# Patient Record
Sex: Female | Born: 1950
Health system: Southern US, Community
[De-identification: ages and names within clinical notes are randomized; demographics above are authoritative.]

## PROBLEM LIST (undated history)

## (undated) DIAGNOSIS — F418 Other specified anxiety disorders: Secondary | ICD-10-CM

## (undated) DIAGNOSIS — R002 Palpitations: Secondary | ICD-10-CM

## (undated) DIAGNOSIS — M810 Age-related osteoporosis without current pathological fracture: Secondary | ICD-10-CM

## (undated) DIAGNOSIS — K5900 Constipation, unspecified: Secondary | ICD-10-CM

## (undated) DIAGNOSIS — E2839 Other primary ovarian failure: Secondary | ICD-10-CM

## (undated) DIAGNOSIS — H9312 Tinnitus, left ear: Secondary | ICD-10-CM

## (undated) DIAGNOSIS — E785 Hyperlipidemia, unspecified: Secondary | ICD-10-CM

## (undated) DIAGNOSIS — E119 Type 2 diabetes mellitus without complications: Secondary | ICD-10-CM

## (undated) DIAGNOSIS — M8580 Other specified disorders of bone density and structure, unspecified site: Secondary | ICD-10-CM

## (undated) DIAGNOSIS — M722 Plantar fascial fibromatosis: Secondary | ICD-10-CM

## (undated) DIAGNOSIS — E669 Obesity, unspecified: Secondary | ICD-10-CM

## (undated) HISTORY — DX: Plantar fascial fibromatosis: M72.2

## (undated) HISTORY — DX: Obesity, unspecified: E66.9

## (undated) HISTORY — DX: Constipation, unspecified: K59.00

## (undated) HISTORY — DX: Tinnitus, left ear: H93.12

## (undated) HISTORY — PX: ABDOMINAL HYSTERECTOMY: SHX81

## (undated) HISTORY — DX: Type 2 diabetes mellitus without complications: E11.9

## (undated) HISTORY — DX: Other primary ovarian failure: E28.39

## (undated) HISTORY — DX: Other specified anxiety disorders: F41.8

## (undated) HISTORY — DX: Palpitations: R00.2

## (undated) HISTORY — DX: Age-related osteoporosis without current pathological fracture: M81.0

## (undated) HISTORY — DX: Hyperlipidemia, unspecified: E78.5

## (undated) HISTORY — DX: Other specified disorders of bone density and structure, unspecified site: M85.80

---

## 2015-11-23 ENCOUNTER — Ambulatory Visit (INDEPENDENT_AMBULATORY_CARE_PROVIDER_SITE_OTHER): Payer: Medicare HMO | Admitting: Family Medicine

## 2015-11-23 VITALS — BP 124/74 | HR 91 | Temp 98.2°F | Resp 18 | Ht 65.0 in | Wt 198.2 lb

## 2015-11-23 DIAGNOSIS — J111 Influenza due to unidentified influenza virus with other respiratory manifestations: Secondary | ICD-10-CM | POA: Diagnosis not present

## 2015-11-23 DIAGNOSIS — R6889 Other general symptoms and signs: Secondary | ICD-10-CM | POA: Diagnosis not present

## 2015-11-23 DIAGNOSIS — J9801 Acute bronchospasm: Secondary | ICD-10-CM | POA: Diagnosis not present

## 2015-11-23 LAB — POCT INFLUENZA A/B
Influenza A, POC: NEGATIVE
Influenza B, POC: NEGATIVE

## 2015-11-23 MED ORDER — OSELTAMIVIR PHOSPHATE 75 MG PO CAPS: 75.0000 mg | ORAL_CAPSULE | Freq: Two times a day (BID) | ORAL | Status: DC

## 2015-11-23 MED ORDER — ALBUTEROL SULFATE HFA 108 (90 BASE) MCG/ACT IN AERS: 2.0000 | INHALATION_SPRAY | Freq: Four times a day (QID) | RESPIRATORY_TRACT | Status: DC | PRN

## 2015-11-23 MED ORDER — PREDNISONE 20 MG PO TABS: ORAL_TABLET | ORAL | Status: DC

## 2015-11-23 MED ORDER — HYDROCODONE-HOMATROPINE 5-1.5 MG/5ML PO SYRP: 5.0000 mL | ORAL_SOLUTION | Freq: Four times a day (QID) | ORAL | Status: DC | PRN

## 2015-11-23 NOTE — Patient Instructions (Addendum)
   IF you received an x-ray today, you will receive an invoice from [facility]. Please contact [facility] at [phone] with questions or concerns regarding your invoice.   IF you received labwork today, you will receive an invoice from [facility]. Please contact [facility] at [phone] with questions or concerns regarding your invoice.   Our billing staff will not be able to assist you with questions regarding bills from these companies.  You will be contacted with the lab results as soon as they are available. The fastest way to get your results is to activate your My Chart account. Instructions are located on the last page of this paperwork. If you have not heard from us regarding the results in 2 weeks, please contact this office.     Influenza, Adult Influenza ("the flu") is a viral infection of the respiratory tract. It occurs more often in winter months because people spend more time in close contact with one another. Influenza can make you feel very sick. Influenza easily spreads from person to person (contagious). CAUSES  Influenza is caused by a virus that infects the respiratory tract. You can catch the virus by breathing in droplets from an infected person's cough or sneeze. You can also catch the virus by touching something that was recently contaminated with the virus and then touching your mouth, nose, or eyes. RISKS AND COMPLICATIONS You may be at risk for a more severe case of influenza if you smoke cigarettes, have diabetes, have chronic heart disease (such as heart failure) or lung disease (such as asthma), or if you have a weakened immune system. Elderly people and pregnant women are also at risk for more serious infections. The most common problem of influenza is a lung infection (pneumonia). Sometimes, this problem can require emergency medical care and may be life threatening. SIGNS AND SYMPTOMS  Symptoms typically last 4  to 10 days and may include:  Fever.  Chills.  Headache, body aches, and muscle aches.  Sore throat.  Chest discomfort and cough.  Poor appetite.  Weakness or feeling tired.  Dizziness.  Nausea or vomiting. DIAGNOSIS  Diagnosis of influenza is often made based on your history and a physical exam. A nose or throat swab test can be done to confirm the diagnosis. TREATMENT  In mild cases, influenza goes away on its own. Treatment is directed at relieving symptoms. For more severe cases, your health care provider may prescribe antiviral medicines to shorten the sickness. Antibiotic medicines are not effective because the infection is caused by a virus, not by bacteria. HOME CARE INSTRUCTIONS  Take medicines only as directed by your health care provider.  Use a cool mist humidifier to make breathing easier.  Get plenty of rest until your temperature returns to normal. This usually takes 3 to 4 days.  Drink enough fluid to keep your urine clear or pale yellow.  Cover yourmouth and nosewhen coughing or sneezing,and wash your handswellto prevent thevirusfrom spreading.  Stay homefromwork orschool untilthe fever is gonefor at least 1full day. PREVENTION  An annual influenza vaccination (flu shot) is the best way to avoid getting influenza. An annual flu shot is now routinely recommended for all adults in the [place] SEEK MEDICAL CARE IF:  You experiencechest pain, yourcough worsens,or you producemore mucus.  Youhave nausea,vomiting, ordiarrhea.  Your fever returns or gets worse. SEEK IMMEDIATE MEDICAL CARE IF:  You havetrouble breathing, you become short of breath,or your skin ornails becomebluish.  You have severe painor   stiffnessin the neck.  You develop a sudden headache, or pain in the face or ear.  You have nausea or vomiting that you cannot control. MAKE SURE YOU:   Understand these instructions.  Will watch your condition.  Will get help  right away if you are not doing well or get worse.   This information is not intended to replace advice given to you by your health care provider. Make sure you discuss any questions you have with your health care provider.   Document Released: 08/24/2000 Document Revised: 09/17/2014 Document Reviewed: 11/26/2011 Elsevier Interactive Patient Education 2016 Elsevier Inc.  

## 2015-11-23 NOTE — Progress Notes (Signed)
Subjective:    Patient ID: Angel Jones, female    DOB: 1950/12/26, 65 y.o.   MRN: [id number]  11/23/2015  Cough; Wheezing; and Fever   HPI This 65 y.o. female presents for evaluation of cough, wheezing, fever.   Onset yesterday. Husband with similar symptoms for four days.  Bronchitis was diagnosed husband.  +fever with body aches.  102.0.  Ibuprofen and went to bed.  Coughing and wheezing.  Sprayed with Lysol.  +HA.  No ear pain; no sore throat.  Mild rhinorrhea; mild nasal congestion. Cough is most prominent symptom. Body aches are horrible.  Convinced suffering with bronchitis and then resolved.  No v/d.  No flu vaccine.  +nausea.  Took some of husband's Prednisone and cough medication.  No asthma; no tobacco.  Drives an activity bus.  PCP:  none     Review of Systems  Constitutional: Positive for fever, chills, diaphoresis and fatigue.  HENT: Positive for congestion and rhinorrhea. Negative for ear pain, sinus pressure and sore throat.   Eyes: Negative for visual disturbance.  Respiratory: Positive for cough, shortness of breath and wheezing.   Cardiovascular: Negative for chest pain, palpitations and leg swelling.  Gastrointestinal: Positive for nausea. Negative for vomiting, abdominal pain, diarrhea and constipation.  Endocrine: Negative for cold intolerance, heat intolerance, polydipsia, polyphagia and polyuria.  Neurological: Positive for headaches. Negative for dizziness, tremors, seizures, syncope, facial asymmetry, speech difficulty, weakness, light-headedness and numbness.    History reviewed. No pertinent past medical history. Past Surgical History  Procedure Laterality Date  . Abdominal hysterectomy     No Known Allergies  Social History   Social History  . Marital Status: Divorced    Spouse Name: N/A  . Number of Children: N/A  . Years of Education: N/A   Occupational History  . Not on file.   Social History Main Topics  . Smoking status: Former Games developermoker  .  Smokeless tobacco: Not on file  . Alcohol Use: No  . Drug Use: No  . Sexual Activity: Not on file   Other Topics Concern  . Not on file   Social History Narrative  . No narrative on file   Family History  Problem Relation Age of Onset  . Diabetes Mother   . Cancer Father   . Stroke Father   . Thyroid disease Sister   . Hypertension Sister   . Bipolar disorder Daughter   . Thyroid disease Sister   . Hypertension Sister        Objective:    BP 124/74 mmHg  Pulse 91  Temp(Src) 98.2 F (36.8 C) (Oral)  Resp 18  Ht 5\' 5"  (1.651 m)  Wt 198 lb 3.2 oz (89.903 kg)  BMI 32.98 kg/m2  SpO2 96% Physical Exam  Constitutional: She is oriented to person, place, and time. She appears well-developed and well-nourished. No distress.  HENT:  Head: Normocephalic and atraumatic.  Right Ear: Tympanic membrane, external ear and ear canal normal.  Left Ear: Tympanic membrane, external ear and ear canal normal.  Nose: Mucosal edema and rhinorrhea present. Right sinus exhibits no maxillary sinus tenderness and no frontal sinus tenderness. Left sinus exhibits no maxillary sinus tenderness and no frontal sinus tenderness.  Mouth/Throat: Uvula is midline, oropharynx is clear and moist and mucous membranes are normal.  Eyes: Conjunctivae and EOM are normal. Pupils are equal, round, and reactive to light.  Neck: Normal range of motion. Neck supple. Carotid bruit is not present. No thyromegaly present.  Cardiovascular: Normal  rate, regular rhythm, normal heart sounds and intact distal pulses.  Exam reveals no gallop and no friction rub.   No murmur heard. Pulmonary/Chest: Effort normal and breath sounds normal. She has no wheezes. She has no rales.  Abdominal: Soft. Bowel sounds are normal. She exhibits no distension and no mass. There is no tenderness. There is no rebound and no guarding.  Lymphadenopathy:    She has no cervical adenopathy.  Neurological: She is alert and oriented to person,  place, and time. No cranial nerve deficit.  Skin: Skin is warm and dry. No rash noted. She is not diaphoretic. No erythema. No pallor.  Psychiatric: She has a normal mood and affect. Her behavior is normal.   Results for orders placed or performed in visit on 11/23/15  POCT Influenza A/B  Result Value Ref Range   Influenza A, POC Negative Negative   Influenza B, POC Negative Negative       Assessment & Plan:   1. Influenza   2. Flu-like symptoms   3. Bronchospasm     Orders Placed This Encounter  Procedures  . POCT Influenza A/B   Meds ordered this encounter  Medications  . oseltamivir (TAMIFLU) 75 MG capsule    Sig: Take 1 capsule (75 mg total) by mouth 2 (two) times daily.    Dispense:  10 capsule    Refill:  0  . predniSONE (DELTASONE) 20 MG tablet    Sig: Two tablets daily x 5 days then one tablet daily x 5 days    Dispense:  15 tablet    Refill:  0  . HYDROcodone-homatropine (HYCODAN) 5-1.5 MG/5ML syrup    Sig: Take 5 mLs by mouth every 6 (six) hours as needed for cough.    Dispense:  180 mL    Refill:  0  . albuterol (PROVENTIL HFA;VENTOLIN HFA) 108 (90 Base) MCG/ACT inhaler    Sig: Inhale 2 puffs into the lungs every 6 (six) hours as needed for wheezing or shortness of breath (cough, shortness of breath or wheezing.).    Dispense:  1 Inhaler    Refill:  1    No Follow-up on file.    Jacob Cicero Paulita Fujita, M.D. [facility] [address] [place], [place]  [address] [phone] phone [phone] fax

## 2018-02-21 DIAGNOSIS — R69 Illness, unspecified: Secondary | ICD-10-CM | POA: Diagnosis not present

## 2018-02-21 DIAGNOSIS — Z825 Family history of asthma and other chronic lower respiratory diseases: Secondary | ICD-10-CM | POA: Diagnosis not present

## 2018-02-21 DIAGNOSIS — K08409 Partial loss of teeth, unspecified cause, unspecified class: Secondary | ICD-10-CM | POA: Diagnosis not present

## 2018-02-21 DIAGNOSIS — Z6832 Body mass index (BMI) 32.0-32.9, adult: Secondary | ICD-10-CM | POA: Diagnosis not present

## 2018-02-21 DIAGNOSIS — E669 Obesity, unspecified: Secondary | ICD-10-CM | POA: Diagnosis not present

## 2018-02-21 DIAGNOSIS — Z833 Family history of diabetes mellitus: Secondary | ICD-10-CM | POA: Diagnosis not present

## 2018-02-21 DIAGNOSIS — G4700 Insomnia, unspecified: Secondary | ICD-10-CM | POA: Diagnosis not present

## 2018-02-21 DIAGNOSIS — Z8249 Family history of ischemic heart disease and other diseases of the circulatory system: Secondary | ICD-10-CM | POA: Diagnosis not present

## 2018-02-21 DIAGNOSIS — Z823 Family history of stroke: Secondary | ICD-10-CM | POA: Diagnosis not present

## 2018-02-21 DIAGNOSIS — Z7722 Contact with and (suspected) exposure to environmental tobacco smoke (acute) (chronic): Secondary | ICD-10-CM | POA: Diagnosis not present

## 2018-03-18 DIAGNOSIS — E119 Type 2 diabetes mellitus without complications: Secondary | ICD-10-CM | POA: Diagnosis not present

## 2018-03-18 DIAGNOSIS — E1165 Type 2 diabetes mellitus with hyperglycemia: Secondary | ICD-10-CM | POA: Diagnosis not present

## 2018-04-01 DIAGNOSIS — E1165 Type 2 diabetes mellitus with hyperglycemia: Secondary | ICD-10-CM | POA: Diagnosis not present

## 2018-04-01 DIAGNOSIS — Z1159 Encounter for screening for other viral diseases: Secondary | ICD-10-CM | POA: Diagnosis not present

## 2018-04-01 DIAGNOSIS — E119 Type 2 diabetes mellitus without complications: Secondary | ICD-10-CM | POA: Diagnosis not present

## 2018-04-01 DIAGNOSIS — Z7984 Long term (current) use of oral hypoglycemic drugs: Secondary | ICD-10-CM | POA: Diagnosis not present

## 2018-07-02 DIAGNOSIS — E119 Type 2 diabetes mellitus without complications: Secondary | ICD-10-CM | POA: Diagnosis not present

## 2018-07-24 DIAGNOSIS — R1013 Epigastric pain: Secondary | ICD-10-CM | POA: Diagnosis not present

## 2018-07-24 DIAGNOSIS — R079 Chest pain, unspecified: Secondary | ICD-10-CM | POA: Diagnosis not present

## 2018-07-24 DIAGNOSIS — R140 Abdominal distension (gaseous): Secondary | ICD-10-CM | POA: Diagnosis not present

## 2018-07-24 DIAGNOSIS — R0789 Other chest pain: Secondary | ICD-10-CM | POA: Diagnosis not present

## 2018-07-28 ENCOUNTER — Other Ambulatory Visit: Payer: Self-pay | Admitting: Family Medicine

## 2018-07-28 DIAGNOSIS — R1013 Epigastric pain: Secondary | ICD-10-CM

## 2018-09-30 DIAGNOSIS — Z87891 Personal history of nicotine dependence: Secondary | ICD-10-CM | POA: Diagnosis not present

## 2018-09-30 DIAGNOSIS — Z6831 Body mass index (BMI) 31.0-31.9, adult: Secondary | ICD-10-CM | POA: Diagnosis not present

## 2018-09-30 DIAGNOSIS — Z7984 Long term (current) use of oral hypoglycemic drugs: Secondary | ICD-10-CM | POA: Diagnosis not present

## 2018-09-30 DIAGNOSIS — Z8249 Family history of ischemic heart disease and other diseases of the circulatory system: Secondary | ICD-10-CM | POA: Diagnosis not present

## 2018-09-30 DIAGNOSIS — Z833 Family history of diabetes mellitus: Secondary | ICD-10-CM | POA: Diagnosis not present

## 2018-09-30 DIAGNOSIS — Z823 Family history of stroke: Secondary | ICD-10-CM | POA: Diagnosis not present

## 2018-09-30 DIAGNOSIS — E669 Obesity, unspecified: Secondary | ICD-10-CM | POA: Diagnosis not present

## 2018-09-30 DIAGNOSIS — Z825 Family history of asthma and other chronic lower respiratory diseases: Secondary | ICD-10-CM | POA: Diagnosis not present

## 2018-09-30 DIAGNOSIS — E119 Type 2 diabetes mellitus without complications: Secondary | ICD-10-CM | POA: Diagnosis not present

## 2018-10-07 ENCOUNTER — Ambulatory Visit
Admission: RE | Admit: 2018-10-07 | Discharge: 2018-10-07 | Disposition: A | Discharge status: Home / Self Care | Payer: Medicare HMO | Source: Ambulatory Visit | Attending: Family Medicine | Admitting: Family Medicine

## 2018-10-07 DIAGNOSIS — R1013 Epigastric pain: Secondary | ICD-10-CM

## 2018-10-13 DIAGNOSIS — R109 Unspecified abdominal pain: Secondary | ICD-10-CM | POA: Diagnosis not present

## 2018-10-13 DIAGNOSIS — E119 Type 2 diabetes mellitus without complications: Secondary | ICD-10-CM | POA: Diagnosis not present

## 2018-10-13 DIAGNOSIS — K5900 Constipation, unspecified: Secondary | ICD-10-CM | POA: Diagnosis not present

## 2018-10-20 DIAGNOSIS — E119 Type 2 diabetes mellitus without complications: Secondary | ICD-10-CM | POA: Diagnosis not present

## 2018-10-20 DIAGNOSIS — K5900 Constipation, unspecified: Secondary | ICD-10-CM | POA: Diagnosis not present

## 2018-10-20 DIAGNOSIS — R109 Unspecified abdominal pain: Secondary | ICD-10-CM | POA: Diagnosis not present

## 2018-11-04 DIAGNOSIS — K5900 Constipation, unspecified: Secondary | ICD-10-CM | POA: Diagnosis not present

## 2018-11-04 DIAGNOSIS — R6881 Early satiety: Secondary | ICD-10-CM | POA: Diagnosis not present

## 2018-11-04 DIAGNOSIS — R109 Unspecified abdominal pain: Secondary | ICD-10-CM | POA: Diagnosis not present

## 2018-11-04 DIAGNOSIS — Z1211 Encounter for screening for malignant neoplasm of colon: Secondary | ICD-10-CM | POA: Diagnosis not present

## 2019-03-23 DIAGNOSIS — Z7984 Long term (current) use of oral hypoglycemic drugs: Secondary | ICD-10-CM | POA: Diagnosis not present

## 2019-03-23 DIAGNOSIS — E1169 Type 2 diabetes mellitus with other specified complication: Secondary | ICD-10-CM | POA: Diagnosis not present

## 2019-07-22 DIAGNOSIS — Z1231 Encounter for screening mammogram for malignant neoplasm of breast: Secondary | ICD-10-CM | POA: Diagnosis not present

## 2019-07-22 DIAGNOSIS — Z1211 Encounter for screening for malignant neoplasm of colon: Secondary | ICD-10-CM | POA: Diagnosis not present

## 2019-07-22 DIAGNOSIS — E1169 Type 2 diabetes mellitus with other specified complication: Secondary | ICD-10-CM | POA: Diagnosis not present

## 2019-07-22 DIAGNOSIS — Z0000 Encounter for general adult medical examination without abnormal findings: Secondary | ICD-10-CM | POA: Diagnosis not present

## 2019-07-22 DIAGNOSIS — E2839 Other primary ovarian failure: Secondary | ICD-10-CM | POA: Diagnosis not present

## 2019-07-24 ENCOUNTER — Other Ambulatory Visit: Payer: Self-pay | Admitting: Family Medicine

## 2019-07-24 DIAGNOSIS — E2839 Other primary ovarian failure: Secondary | ICD-10-CM

## 2019-07-24 DIAGNOSIS — Z1231 Encounter for screening mammogram for malignant neoplasm of breast: Secondary | ICD-10-CM

## 2019-08-25 DIAGNOSIS — E113391 Type 2 diabetes mellitus with moderate nonproliferative diabetic retinopathy without macular edema, right eye: Secondary | ICD-10-CM | POA: Diagnosis not present

## 2019-09-17 DIAGNOSIS — Z0100 Encounter for examination of eyes and vision without abnormal findings: Secondary | ICD-10-CM | POA: Diagnosis not present

## 2019-10-14 DIAGNOSIS — M542 Cervicalgia: Secondary | ICD-10-CM | POA: Diagnosis not present

## 2019-10-14 DIAGNOSIS — M47812 Spondylosis without myelopathy or radiculopathy, cervical region: Secondary | ICD-10-CM | POA: Diagnosis not present

## 2019-10-14 DIAGNOSIS — M9901 Segmental and somatic dysfunction of cervical region: Secondary | ICD-10-CM | POA: Diagnosis not present

## 2019-10-14 DIAGNOSIS — M5412 Radiculopathy, cervical region: Secondary | ICD-10-CM | POA: Diagnosis not present

## 2019-10-15 ENCOUNTER — Ambulatory Visit
Admission: RE | Admit: 2019-10-15 | Discharge: 2019-10-15 | Disposition: A | Discharge status: Home / Self Care | Payer: Medicare HMO | Source: Ambulatory Visit | Attending: Family Medicine | Admitting: Family Medicine

## 2019-10-15 ENCOUNTER — Other Ambulatory Visit: Payer: Self-pay

## 2019-10-15 DIAGNOSIS — E2839 Other primary ovarian failure: Secondary | ICD-10-CM

## 2019-10-15 DIAGNOSIS — Z780 Asymptomatic menopausal state: Secondary | ICD-10-CM | POA: Diagnosis not present

## 2019-10-15 DIAGNOSIS — Z1231 Encounter for screening mammogram for malignant neoplasm of breast: Secondary | ICD-10-CM | POA: Diagnosis not present

## 2019-10-15 DIAGNOSIS — M85851 Other specified disorders of bone density and structure, right thigh: Secondary | ICD-10-CM | POA: Diagnosis not present

## 2019-10-16 DIAGNOSIS — M542 Cervicalgia: Secondary | ICD-10-CM | POA: Diagnosis not present

## 2019-10-16 DIAGNOSIS — M9901 Segmental and somatic dysfunction of cervical region: Secondary | ICD-10-CM | POA: Diagnosis not present

## 2019-10-16 DIAGNOSIS — M5412 Radiculopathy, cervical region: Secondary | ICD-10-CM | POA: Diagnosis not present

## 2019-10-16 DIAGNOSIS — M47812 Spondylosis without myelopathy or radiculopathy, cervical region: Secondary | ICD-10-CM | POA: Diagnosis not present

## 2019-10-20 DIAGNOSIS — M5412 Radiculopathy, cervical region: Secondary | ICD-10-CM | POA: Diagnosis not present

## 2019-10-20 DIAGNOSIS — M542 Cervicalgia: Secondary | ICD-10-CM | POA: Diagnosis not present

## 2019-10-20 DIAGNOSIS — M47812 Spondylosis without myelopathy or radiculopathy, cervical region: Secondary | ICD-10-CM | POA: Diagnosis not present

## 2019-10-20 DIAGNOSIS — M9901 Segmental and somatic dysfunction of cervical region: Secondary | ICD-10-CM | POA: Diagnosis not present

## 2019-10-22 DIAGNOSIS — M5412 Radiculopathy, cervical region: Secondary | ICD-10-CM | POA: Diagnosis not present

## 2019-10-22 DIAGNOSIS — M47812 Spondylosis without myelopathy or radiculopathy, cervical region: Secondary | ICD-10-CM | POA: Diagnosis not present

## 2019-10-22 DIAGNOSIS — M9901 Segmental and somatic dysfunction of cervical region: Secondary | ICD-10-CM | POA: Diagnosis not present

## 2019-10-22 DIAGNOSIS — M542 Cervicalgia: Secondary | ICD-10-CM | POA: Diagnosis not present

## 2019-10-26 DIAGNOSIS — M47812 Spondylosis without myelopathy or radiculopathy, cervical region: Secondary | ICD-10-CM | POA: Diagnosis not present

## 2019-10-26 DIAGNOSIS — M9901 Segmental and somatic dysfunction of cervical region: Secondary | ICD-10-CM | POA: Diagnosis not present

## 2019-10-26 DIAGNOSIS — M542 Cervicalgia: Secondary | ICD-10-CM | POA: Diagnosis not present

## 2019-10-26 DIAGNOSIS — M5412 Radiculopathy, cervical region: Secondary | ICD-10-CM | POA: Diagnosis not present

## 2019-10-30 DIAGNOSIS — M5412 Radiculopathy, cervical region: Secondary | ICD-10-CM | POA: Diagnosis not present

## 2019-10-30 DIAGNOSIS — M47812 Spondylosis without myelopathy or radiculopathy, cervical region: Secondary | ICD-10-CM | POA: Diagnosis not present

## 2019-10-30 DIAGNOSIS — M542 Cervicalgia: Secondary | ICD-10-CM | POA: Diagnosis not present

## 2019-10-30 DIAGNOSIS — M9901 Segmental and somatic dysfunction of cervical region: Secondary | ICD-10-CM | POA: Diagnosis not present

## 2019-11-04 DIAGNOSIS — M79671 Pain in right foot: Secondary | ICD-10-CM | POA: Diagnosis not present

## 2019-11-04 DIAGNOSIS — M7751 Other enthesopathy of right foot: Secondary | ICD-10-CM | POA: Diagnosis not present

## 2019-11-04 DIAGNOSIS — M722 Plantar fascial fibromatosis: Secondary | ICD-10-CM | POA: Diagnosis not present

## 2019-11-19 DIAGNOSIS — M722 Plantar fascial fibromatosis: Secondary | ICD-10-CM | POA: Diagnosis not present

## 2019-11-19 DIAGNOSIS — M71571 Other bursitis, not elsewhere classified, right ankle and foot: Secondary | ICD-10-CM | POA: Diagnosis not present

## 2019-11-19 DIAGNOSIS — E119 Type 2 diabetes mellitus without complications: Secondary | ICD-10-CM | POA: Diagnosis not present

## 2019-11-27 DIAGNOSIS — M9901 Segmental and somatic dysfunction of cervical region: Secondary | ICD-10-CM | POA: Diagnosis not present

## 2019-11-27 DIAGNOSIS — M47812 Spondylosis without myelopathy or radiculopathy, cervical region: Secondary | ICD-10-CM | POA: Diagnosis not present

## 2019-11-27 DIAGNOSIS — M542 Cervicalgia: Secondary | ICD-10-CM | POA: Diagnosis not present

## 2019-11-27 DIAGNOSIS — M5412 Radiculopathy, cervical region: Secondary | ICD-10-CM | POA: Diagnosis not present

## 2019-12-28 DIAGNOSIS — M5412 Radiculopathy, cervical region: Secondary | ICD-10-CM | POA: Diagnosis not present

## 2019-12-28 DIAGNOSIS — M542 Cervicalgia: Secondary | ICD-10-CM | POA: Diagnosis not present

## 2019-12-28 DIAGNOSIS — M47812 Spondylosis without myelopathy or radiculopathy, cervical region: Secondary | ICD-10-CM | POA: Diagnosis not present

## 2019-12-28 DIAGNOSIS — M9901 Segmental and somatic dysfunction of cervical region: Secondary | ICD-10-CM | POA: Diagnosis not present

## 2020-01-15 DIAGNOSIS — H9312 Tinnitus, left ear: Secondary | ICD-10-CM | POA: Diagnosis not present

## 2020-01-15 DIAGNOSIS — E1169 Type 2 diabetes mellitus with other specified complication: Secondary | ICD-10-CM | POA: Diagnosis not present

## 2020-01-15 DIAGNOSIS — R69 Illness, unspecified: Secondary | ICD-10-CM | POA: Diagnosis not present

## 2020-01-15 DIAGNOSIS — L989 Disorder of the skin and subcutaneous tissue, unspecified: Secondary | ICD-10-CM | POA: Diagnosis not present

## 2020-01-15 DIAGNOSIS — R002 Palpitations: Secondary | ICD-10-CM | POA: Diagnosis not present

## 2020-01-15 DIAGNOSIS — E669 Obesity, unspecified: Secondary | ICD-10-CM | POA: Diagnosis not present

## 2020-01-27 DIAGNOSIS — M9901 Segmental and somatic dysfunction of cervical region: Secondary | ICD-10-CM | POA: Diagnosis not present

## 2020-01-27 DIAGNOSIS — M542 Cervicalgia: Secondary | ICD-10-CM | POA: Diagnosis not present

## 2020-01-27 DIAGNOSIS — M47812 Spondylosis without myelopathy or radiculopathy, cervical region: Secondary | ICD-10-CM | POA: Diagnosis not present

## 2020-01-27 DIAGNOSIS — M5412 Radiculopathy, cervical region: Secondary | ICD-10-CM | POA: Diagnosis not present

## 2020-02-23 DIAGNOSIS — E113293 Type 2 diabetes mellitus with mild nonproliferative diabetic retinopathy without macular edema, bilateral: Secondary | ICD-10-CM | POA: Diagnosis not present

## 2020-02-24 DIAGNOSIS — M9901 Segmental and somatic dysfunction of cervical region: Secondary | ICD-10-CM | POA: Diagnosis not present

## 2020-02-24 DIAGNOSIS — M5412 Radiculopathy, cervical region: Secondary | ICD-10-CM | POA: Diagnosis not present

## 2020-02-24 DIAGNOSIS — M542 Cervicalgia: Secondary | ICD-10-CM | POA: Diagnosis not present

## 2020-02-24 DIAGNOSIS — M47812 Spondylosis without myelopathy or radiculopathy, cervical region: Secondary | ICD-10-CM | POA: Diagnosis not present

## 2020-02-29 ENCOUNTER — Other Ambulatory Visit: Payer: Self-pay

## 2020-02-29 ENCOUNTER — Encounter: Payer: Self-pay | Admitting: Interventional Cardiology

## 2020-02-29 ENCOUNTER — Ambulatory Visit: Payer: Medicare HMO | Admitting: Interventional Cardiology

## 2020-02-29 VITALS — BP 126/76 | HR 93 | Ht 66.0 in | Wt 195.4 lb

## 2020-02-29 DIAGNOSIS — R002 Palpitations: Secondary | ICD-10-CM

## 2020-02-29 DIAGNOSIS — E785 Hyperlipidemia, unspecified: Secondary | ICD-10-CM

## 2020-02-29 DIAGNOSIS — E118 Type 2 diabetes mellitus with unspecified complications: Secondary | ICD-10-CM

## 2020-02-29 DIAGNOSIS — H539 Unspecified visual disturbance: Secondary | ICD-10-CM

## 2020-02-29 NOTE — Patient Instructions (Signed)
Medication Instructions:  Your physician recommends that you continue on your current medications as directed. Please refer to the Current Medication list given to you today.  *If you need a refill on your cardiac medications before your next appointment, please call your pharmacy*   Lab Work: None  If you have labs (blood work) drawn today and your tests are completely normal, you will receive your results only by: Marland Kitchen MyChart Message (if you have MyChart) OR . A paper copy in the mail If you have any lab test that is abnormal or we need to change your treatment, we will call you to review the results.   Testing/Procedures: Your physician has requested that you have a carotid duplex. This test is an ultrasound of the carotid arteries in your neck. It looks at blood flow through these arteries that supply the brain with blood. Allow one hour for this exam. There are no restrictions or special instructions.  Your physician recommends that you have a Calcium Score CT.    Follow-Up: Based on test results  Other Instructions Decrease caffeine intake

## 2020-02-29 NOTE — Progress Notes (Signed)
Cardiology Office Note   Date:  02/29/2020   ID:  [name], Angel Jones [birth year], MRN [id number]  PCP:  Kristen Loader, FNP    No chief complaint on file.  palpitations  Wt Readings from Last 3 Encounters:  02/29/20 195 lb 6.4 oz (88.6 kg)  11/23/15 198 lb 3.2 oz (89.9 kg)       History of Present Illness: Angel Jones is a 69 y.o. female who is being seen today for the evaluation of palpitaitons at the request of Kristen Loader, FNP.  She has had palpitations for the past  Few weeks.  SHe was taking a green tea supplement to increase energy, sometimes with coffee.  It feels like a skip lasting for a few seconds and resolves.  She has an occasional left bicep pain.   She has a family h/o heart problems. Sister with DM.  She tried to control DM with diet in the past.  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  Now on meds. A1C 9.1 in July 2020.  SHe has to make an appt to get checked.  She does walk up stairs to get to her apartment.  Exercise is limited by plantar fasciitis.       Past Medical History:  Diagnosis Date  . Constipation   . DM (diabetes mellitus) ([place])   . Estrogen deficiency   . Hyperlipidemia   . Obesity   . Osteopenia   . Osteoporosis   . Osteoporosis   . Palpitations   . Plantar fasciitis   . Plantar fasciitis   . Situational anxiety   . Tinnitus of left ear     Past Surgical History:  Procedure Laterality Date  . ABDOMINAL HYSTERECTOMY       Current Outpatient Medications  Medication Sig Dispense Refill  . alendronate (FOSAMAX) 70 MG tablet Take 70 mg by mouth once a week.    . ALOE PO Take by mouth.    . calcium citrate-vitamin D (CITRACAL+D) 315-200 MG-UNIT tablet Take 1 tablet by mouth 2 (two) times daily.    . Coenzyme Q10 (Q-10 CO-ENZYME PO) Take by mouth.    . Docosahexaenoic Acid (DHA COMPLETE) 200 MG CAPS Take by mouth.    Marland Kitchen GLIPIZIDE ER PO Take 5 mg by mouth.    . IBUPROFEN PO Take by mouth as needed.     .  Melatonin 5 MG CAPS Take 15 mg by mouth at bedtime.     . meloxicam (MOBIC) 7.5 MG tablet Take 7.5 mg by mouth 2 (two) times daily as needed.    . metFORMIN (GLUCOPHAGE-XR) 750 MG 24 hr tablet SMARTSIG:1 Tablet(s) By Mouth Every Evening    . Multiple Vitamin (MULTIVITAMIN) tablet Take 1 tablet by mouth daily.    . Nutritional Supplements (ESTROVEN PO) Take by mouth.    . Probiotic Product (PROBIOTIC DAILY PO) Take by mouth.    . Red Yeast Rice 600 MG CAPS Take by mouth.    . Turmeric Curcumin 500 MG CAPS Take by mouth.    . vitamin B-12 (CYANOCOBALAMIN) 1000 MCG tablet Take 1,000 mcg by mouth daily.    . simvastatin (ZOCOR) 10 MG tablet Take 10 mg by mouth at bedtime. (Patient not taking: Reported on 02/29/2020)     No current facility-administered medications for this visit.    Allergies:   Patient has no known allergies.    Social History:  The patient  reports that she has quit smoking. She has  never used smokeless tobacco. She reports that she does not drink alcohol and does not use drugs.   Family History:  The patient's family history includes Bipolar disorder in her daughter; Cancer in her father; Diabetes in her mother; Hypertension in her sister and sister; Stroke in her father; Thyroid disease in her sister and sister.    ROS:  Please see the history of present illness.   Otherwise, review of systems are positive for foot pain; transient visual disturbance- blurry vision.   All other systems are reviewed and negative.    PHYSICAL EXAM: VS:  BP 126/76   Pulse 93   Ht 5\' 6"  (1.676 m)   Wt 195 lb 6.4 oz (88.6 kg)   SpO2 95%   BMI 31.54 kg/m  , BMI Body mass index is 31.54 kg/m. GEN: Well nourished, well developed, in no acute distress  HEENT: normal  Neck: no JVD, carotid bruits, or masses Cardiac: RRR; no murmurs, rubs, or gallops,no edema  Respiratory:  clear to auscultation bilaterally, normal work of breathing GI: soft, nontender, nondistended, + BS MS: no deformity  or atrophy  Skin: warm and dry, no rash Neuro:  Strength and sensation are intact Psych: euthymic mood, full affect   EKG:   The ekg ordered today demonstrates normal sinus rhythm, no ST changes   Recent Labs: No results found for requested labs within last 8760 hours.   Lipid Panel No results found for: CHOL, TRIG, HDL, CHOLHDL, VLDL, LDLCALC, LDLDIRECT   Other studies Reviewed: Additional studies/ records that were reviewed today with results demonstrating: 2019 labs reviewed.   ASSESSMENT AND PLAN:  1. Palpitations: Sound like premature beats.   Nothing sustained.  May have been exacerbated by recent caffeine use.  Some tachycardia during hot flashes.  Decrease caffeine intake.  2. DM: Try to increase activity.   Whole food plant based diet.   Discussed calcium scoring CT scan.  3. Hyperlipidemia: Continue simvastatin.   4. Negative lifeline screen in 2006.   5. Plan for carotid DOppler due to visual disturbance. 6. Got COVID vaccines.    Current medicines are reviewed at length with the patient today.  The patient concerns regarding her medicines were addressed.  The following changes have been made:  No change  Labs/ tests ordered today include:  No orders of the defined types were placed in this encounter.   Recommend 150 minutes/week of aerobic exercise Low fat, low carb, high fiber diet recommended  Disposition:   FU in based on results   Signed, 2007, MD  02/29/2020 11:40 AM    [facility] [address] [place], [place], [place]  [place] Phone: [phone]; Fax: [phone]

## 2020-03-17 ENCOUNTER — Encounter (HOSPITAL_COMMUNITY): Payer: Medicare HMO

## 2020-03-21 ENCOUNTER — Inpatient Hospital Stay (HOSPITAL_COMMUNITY): Admission: RE | Admit: 2020-03-21 | Payer: Medicare HMO | Source: Ambulatory Visit

## 2020-03-21 DIAGNOSIS — L438 Other lichen planus: Secondary | ICD-10-CM | POA: Diagnosis not present

## 2020-03-21 DIAGNOSIS — L814 Other melanin hyperpigmentation: Secondary | ICD-10-CM | POA: Diagnosis not present

## 2020-03-21 DIAGNOSIS — L218 Other seborrheic dermatitis: Secondary | ICD-10-CM | POA: Diagnosis not present

## 2020-03-21 DIAGNOSIS — L821 Other seborrheic keratosis: Secondary | ICD-10-CM | POA: Diagnosis not present

## 2020-03-22 ENCOUNTER — Other Ambulatory Visit: Payer: Medicare HMO

## 2020-03-24 DIAGNOSIS — M5412 Radiculopathy, cervical region: Secondary | ICD-10-CM | POA: Diagnosis not present

## 2020-03-24 DIAGNOSIS — M542 Cervicalgia: Secondary | ICD-10-CM | POA: Diagnosis not present

## 2020-03-24 DIAGNOSIS — M47812 Spondylosis without myelopathy or radiculopathy, cervical region: Secondary | ICD-10-CM | POA: Diagnosis not present

## 2020-03-24 DIAGNOSIS — M9901 Segmental and somatic dysfunction of cervical region: Secondary | ICD-10-CM | POA: Diagnosis not present

## 2020-03-25 DIAGNOSIS — R809 Proteinuria, unspecified: Secondary | ICD-10-CM | POA: Diagnosis not present

## 2020-03-25 DIAGNOSIS — E1169 Type 2 diabetes mellitus with other specified complication: Secondary | ICD-10-CM | POA: Diagnosis not present

## 2020-03-25 DIAGNOSIS — M8580 Other specified disorders of bone density and structure, unspecified site: Secondary | ICD-10-CM | POA: Diagnosis not present

## 2020-04-07 ENCOUNTER — Ambulatory Visit (HOSPITAL_COMMUNITY): Payer: Medicare HMO

## 2020-04-28 DIAGNOSIS — M542 Cervicalgia: Secondary | ICD-10-CM | POA: Diagnosis not present

## 2020-04-28 DIAGNOSIS — M9901 Segmental and somatic dysfunction of cervical region: Secondary | ICD-10-CM | POA: Diagnosis not present

## 2020-04-28 DIAGNOSIS — M5412 Radiculopathy, cervical region: Secondary | ICD-10-CM | POA: Diagnosis not present

## 2020-04-28 DIAGNOSIS — M47812 Spondylosis without myelopathy or radiculopathy, cervical region: Secondary | ICD-10-CM | POA: Diagnosis not present

## 2020-05-12 DIAGNOSIS — M542 Cervicalgia: Secondary | ICD-10-CM | POA: Diagnosis not present

## 2020-05-12 DIAGNOSIS — M5412 Radiculopathy, cervical region: Secondary | ICD-10-CM | POA: Diagnosis not present

## 2020-05-12 DIAGNOSIS — M47812 Spondylosis without myelopathy or radiculopathy, cervical region: Secondary | ICD-10-CM | POA: Diagnosis not present

## 2020-05-12 DIAGNOSIS — M9901 Segmental and somatic dysfunction of cervical region: Secondary | ICD-10-CM | POA: Diagnosis not present

## 2020-05-20 DIAGNOSIS — M542 Cervicalgia: Secondary | ICD-10-CM | POA: Diagnosis not present

## 2020-05-20 DIAGNOSIS — M9901 Segmental and somatic dysfunction of cervical region: Secondary | ICD-10-CM | POA: Diagnosis not present

## 2020-05-20 DIAGNOSIS — M5412 Radiculopathy, cervical region: Secondary | ICD-10-CM | POA: Diagnosis not present

## 2020-05-20 DIAGNOSIS — M47812 Spondylosis without myelopathy or radiculopathy, cervical region: Secondary | ICD-10-CM | POA: Diagnosis not present

## 2020-05-26 DIAGNOSIS — M9901 Segmental and somatic dysfunction of cervical region: Secondary | ICD-10-CM | POA: Diagnosis not present

## 2020-05-26 DIAGNOSIS — M47812 Spondylosis without myelopathy or radiculopathy, cervical region: Secondary | ICD-10-CM | POA: Diagnosis not present

## 2020-05-26 DIAGNOSIS — M5412 Radiculopathy, cervical region: Secondary | ICD-10-CM | POA: Diagnosis not present

## 2020-05-26 DIAGNOSIS — M542 Cervicalgia: Secondary | ICD-10-CM | POA: Diagnosis not present

## 2020-10-26 DIAGNOSIS — M9903 Segmental and somatic dysfunction of lumbar region: Secondary | ICD-10-CM | POA: Diagnosis not present

## 2020-10-26 DIAGNOSIS — M530 Cervicocranial syndrome: Secondary | ICD-10-CM | POA: Diagnosis not present

## 2020-10-26 DIAGNOSIS — M9902 Segmental and somatic dysfunction of thoracic region: Secondary | ICD-10-CM | POA: Diagnosis not present

## 2020-10-26 DIAGNOSIS — M9901 Segmental and somatic dysfunction of cervical region: Secondary | ICD-10-CM | POA: Diagnosis not present

## 2020-10-27 DIAGNOSIS — M9901 Segmental and somatic dysfunction of cervical region: Secondary | ICD-10-CM | POA: Diagnosis not present

## 2020-10-27 DIAGNOSIS — M9902 Segmental and somatic dysfunction of thoracic region: Secondary | ICD-10-CM | POA: Diagnosis not present

## 2020-10-27 DIAGNOSIS — M9903 Segmental and somatic dysfunction of lumbar region: Secondary | ICD-10-CM | POA: Diagnosis not present

## 2020-10-27 DIAGNOSIS — M530 Cervicocranial syndrome: Secondary | ICD-10-CM | POA: Diagnosis not present

## 2020-10-29 DIAGNOSIS — M9903 Segmental and somatic dysfunction of lumbar region: Secondary | ICD-10-CM | POA: Diagnosis not present

## 2020-10-29 DIAGNOSIS — M530 Cervicocranial syndrome: Secondary | ICD-10-CM | POA: Diagnosis not present

## 2020-10-29 DIAGNOSIS — M9902 Segmental and somatic dysfunction of thoracic region: Secondary | ICD-10-CM | POA: Diagnosis not present

## 2020-10-29 DIAGNOSIS — M9901 Segmental and somatic dysfunction of cervical region: Secondary | ICD-10-CM | POA: Diagnosis not present

## 2020-10-31 DIAGNOSIS — M9901 Segmental and somatic dysfunction of cervical region: Secondary | ICD-10-CM | POA: Diagnosis not present

## 2020-10-31 DIAGNOSIS — M9903 Segmental and somatic dysfunction of lumbar region: Secondary | ICD-10-CM | POA: Diagnosis not present

## 2020-10-31 DIAGNOSIS — M9902 Segmental and somatic dysfunction of thoracic region: Secondary | ICD-10-CM | POA: Diagnosis not present

## 2020-10-31 DIAGNOSIS — M530 Cervicocranial syndrome: Secondary | ICD-10-CM | POA: Diagnosis not present

## 2020-11-02 DIAGNOSIS — M9901 Segmental and somatic dysfunction of cervical region: Secondary | ICD-10-CM | POA: Diagnosis not present

## 2020-11-02 DIAGNOSIS — M9903 Segmental and somatic dysfunction of lumbar region: Secondary | ICD-10-CM | POA: Diagnosis not present

## 2020-11-02 DIAGNOSIS — M530 Cervicocranial syndrome: Secondary | ICD-10-CM | POA: Diagnosis not present

## 2020-11-02 DIAGNOSIS — M9902 Segmental and somatic dysfunction of thoracic region: Secondary | ICD-10-CM | POA: Diagnosis not present

## 2020-11-04 DIAGNOSIS — M9903 Segmental and somatic dysfunction of lumbar region: Secondary | ICD-10-CM | POA: Diagnosis not present

## 2020-11-04 DIAGNOSIS — M9902 Segmental and somatic dysfunction of thoracic region: Secondary | ICD-10-CM | POA: Diagnosis not present

## 2020-11-04 DIAGNOSIS — M530 Cervicocranial syndrome: Secondary | ICD-10-CM | POA: Diagnosis not present

## 2020-11-04 DIAGNOSIS — M9901 Segmental and somatic dysfunction of cervical region: Secondary | ICD-10-CM | POA: Diagnosis not present

## 2020-11-07 DIAGNOSIS — M9901 Segmental and somatic dysfunction of cervical region: Secondary | ICD-10-CM | POA: Diagnosis not present

## 2020-11-07 DIAGNOSIS — M9902 Segmental and somatic dysfunction of thoracic region: Secondary | ICD-10-CM | POA: Diagnosis not present

## 2020-11-07 DIAGNOSIS — M9903 Segmental and somatic dysfunction of lumbar region: Secondary | ICD-10-CM | POA: Diagnosis not present

## 2020-11-07 DIAGNOSIS — M530 Cervicocranial syndrome: Secondary | ICD-10-CM | POA: Diagnosis not present

## 2020-11-08 DIAGNOSIS — M9902 Segmental and somatic dysfunction of thoracic region: Secondary | ICD-10-CM | POA: Diagnosis not present

## 2020-11-08 DIAGNOSIS — M9901 Segmental and somatic dysfunction of cervical region: Secondary | ICD-10-CM | POA: Diagnosis not present

## 2020-11-08 DIAGNOSIS — M530 Cervicocranial syndrome: Secondary | ICD-10-CM | POA: Diagnosis not present

## 2020-11-08 DIAGNOSIS — M9903 Segmental and somatic dysfunction of lumbar region: Secondary | ICD-10-CM | POA: Diagnosis not present

## 2020-11-11 DIAGNOSIS — M9903 Segmental and somatic dysfunction of lumbar region: Secondary | ICD-10-CM | POA: Diagnosis not present

## 2020-11-11 DIAGNOSIS — M9902 Segmental and somatic dysfunction of thoracic region: Secondary | ICD-10-CM | POA: Diagnosis not present

## 2020-11-11 DIAGNOSIS — M9901 Segmental and somatic dysfunction of cervical region: Secondary | ICD-10-CM | POA: Diagnosis not present

## 2020-11-11 DIAGNOSIS — M530 Cervicocranial syndrome: Secondary | ICD-10-CM | POA: Diagnosis not present

## 2020-11-14 DIAGNOSIS — M530 Cervicocranial syndrome: Secondary | ICD-10-CM | POA: Diagnosis not present

## 2020-11-14 DIAGNOSIS — M9903 Segmental and somatic dysfunction of lumbar region: Secondary | ICD-10-CM | POA: Diagnosis not present

## 2020-11-14 DIAGNOSIS — M9901 Segmental and somatic dysfunction of cervical region: Secondary | ICD-10-CM | POA: Diagnosis not present

## 2020-11-14 DIAGNOSIS — M9902 Segmental and somatic dysfunction of thoracic region: Secondary | ICD-10-CM | POA: Diagnosis not present

## 2020-11-18 DIAGNOSIS — M9901 Segmental and somatic dysfunction of cervical region: Secondary | ICD-10-CM | POA: Diagnosis not present

## 2020-11-18 DIAGNOSIS — M9902 Segmental and somatic dysfunction of thoracic region: Secondary | ICD-10-CM | POA: Diagnosis not present

## 2020-11-18 DIAGNOSIS — M9903 Segmental and somatic dysfunction of lumbar region: Secondary | ICD-10-CM | POA: Diagnosis not present

## 2020-11-18 DIAGNOSIS — M530 Cervicocranial syndrome: Secondary | ICD-10-CM | POA: Diagnosis not present

## 2020-11-21 DIAGNOSIS — M9903 Segmental and somatic dysfunction of lumbar region: Secondary | ICD-10-CM | POA: Diagnosis not present

## 2020-11-21 DIAGNOSIS — M9901 Segmental and somatic dysfunction of cervical region: Secondary | ICD-10-CM | POA: Diagnosis not present

## 2020-11-21 DIAGNOSIS — M9902 Segmental and somatic dysfunction of thoracic region: Secondary | ICD-10-CM | POA: Diagnosis not present

## 2020-11-21 DIAGNOSIS — M530 Cervicocranial syndrome: Secondary | ICD-10-CM | POA: Diagnosis not present

## 2020-11-28 DIAGNOSIS — M9902 Segmental and somatic dysfunction of thoracic region: Secondary | ICD-10-CM | POA: Diagnosis not present

## 2020-11-28 DIAGNOSIS — M9901 Segmental and somatic dysfunction of cervical region: Secondary | ICD-10-CM | POA: Diagnosis not present

## 2020-11-28 DIAGNOSIS — M9903 Segmental and somatic dysfunction of lumbar region: Secondary | ICD-10-CM | POA: Diagnosis not present

## 2020-11-28 DIAGNOSIS — M530 Cervicocranial syndrome: Secondary | ICD-10-CM | POA: Diagnosis not present

## 2021-02-24 DIAGNOSIS — S134XXA Sprain of ligaments of cervical spine, initial encounter: Secondary | ICD-10-CM | POA: Diagnosis not present

## 2021-03-21 DIAGNOSIS — E1169 Type 2 diabetes mellitus with other specified complication: Secondary | ICD-10-CM | POA: Diagnosis not present

## 2021-03-21 DIAGNOSIS — R809 Proteinuria, unspecified: Secondary | ICD-10-CM | POA: Diagnosis not present

## 2021-03-21 DIAGNOSIS — Z6829 Body mass index (BMI) 29.0-29.9, adult: Secondary | ICD-10-CM | POA: Diagnosis not present

## 2021-03-21 DIAGNOSIS — E039 Hypothyroidism, unspecified: Secondary | ICD-10-CM | POA: Diagnosis not present

## 2021-03-29 DIAGNOSIS — E119 Type 2 diabetes mellitus without complications: Secondary | ICD-10-CM | POA: Diagnosis not present

## 2021-03-29 DIAGNOSIS — E1169 Type 2 diabetes mellitus with other specified complication: Secondary | ICD-10-CM | POA: Diagnosis not present

## 2021-03-29 DIAGNOSIS — M8580 Other specified disorders of bone density and structure, unspecified site: Secondary | ICD-10-CM | POA: Diagnosis not present

## 2021-03-29 DIAGNOSIS — E1165 Type 2 diabetes mellitus with hyperglycemia: Secondary | ICD-10-CM | POA: Diagnosis not present

## 2021-04-12 DIAGNOSIS — U071 COVID-19: Secondary | ICD-10-CM | POA: Diagnosis not present

## 2021-05-02 DIAGNOSIS — E1165 Type 2 diabetes mellitus with hyperglycemia: Secondary | ICD-10-CM | POA: Diagnosis not present

## 2021-05-02 DIAGNOSIS — E119 Type 2 diabetes mellitus without complications: Secondary | ICD-10-CM | POA: Diagnosis not present

## 2021-05-02 DIAGNOSIS — M8580 Other specified disorders of bone density and structure, unspecified site: Secondary | ICD-10-CM | POA: Diagnosis not present

## 2021-05-02 DIAGNOSIS — E1169 Type 2 diabetes mellitus with other specified complication: Secondary | ICD-10-CM | POA: Diagnosis not present

## 2021-05-10 DIAGNOSIS — E1165 Type 2 diabetes mellitus with hyperglycemia: Secondary | ICD-10-CM | POA: Diagnosis not present

## 2021-05-23 DIAGNOSIS — M8580 Other specified disorders of bone density and structure, unspecified site: Secondary | ICD-10-CM | POA: Diagnosis not present

## 2021-05-23 DIAGNOSIS — E119 Type 2 diabetes mellitus without complications: Secondary | ICD-10-CM | POA: Diagnosis not present

## 2021-05-23 DIAGNOSIS — E1169 Type 2 diabetes mellitus with other specified complication: Secondary | ICD-10-CM | POA: Diagnosis not present

## 2021-05-23 DIAGNOSIS — E1165 Type 2 diabetes mellitus with hyperglycemia: Secondary | ICD-10-CM | POA: Diagnosis not present

## 2021-06-09 DIAGNOSIS — E1165 Type 2 diabetes mellitus with hyperglycemia: Secondary | ICD-10-CM | POA: Diagnosis not present

## 2021-06-28 DIAGNOSIS — E1165 Type 2 diabetes mellitus with hyperglycemia: Secondary | ICD-10-CM | POA: Diagnosis not present

## 2021-07-10 DIAGNOSIS — E1169 Type 2 diabetes mellitus with other specified complication: Secondary | ICD-10-CM | POA: Diagnosis not present

## 2021-07-13 DIAGNOSIS — E119 Type 2 diabetes mellitus without complications: Secondary | ICD-10-CM | POA: Diagnosis not present

## 2021-07-13 DIAGNOSIS — M8580 Other specified disorders of bone density and structure, unspecified site: Secondary | ICD-10-CM | POA: Diagnosis not present

## 2021-07-13 DIAGNOSIS — E1169 Type 2 diabetes mellitus with other specified complication: Secondary | ICD-10-CM | POA: Diagnosis not present

## 2021-07-13 DIAGNOSIS — E1165 Type 2 diabetes mellitus with hyperglycemia: Secondary | ICD-10-CM | POA: Diagnosis not present

## 2021-07-28 IMAGING — MG DIGITAL SCREENING BILAT W/ TOMO W/ CAD
8 series · 8 of 24 positions shown · non-contrast
Comparison: None.

CLINICAL DATA: Screening.

EXAM:
DIGITAL SCREENING BILATERAL MAMMOGRAM WITH TOMO AND CAD

[L CC synth-2D]
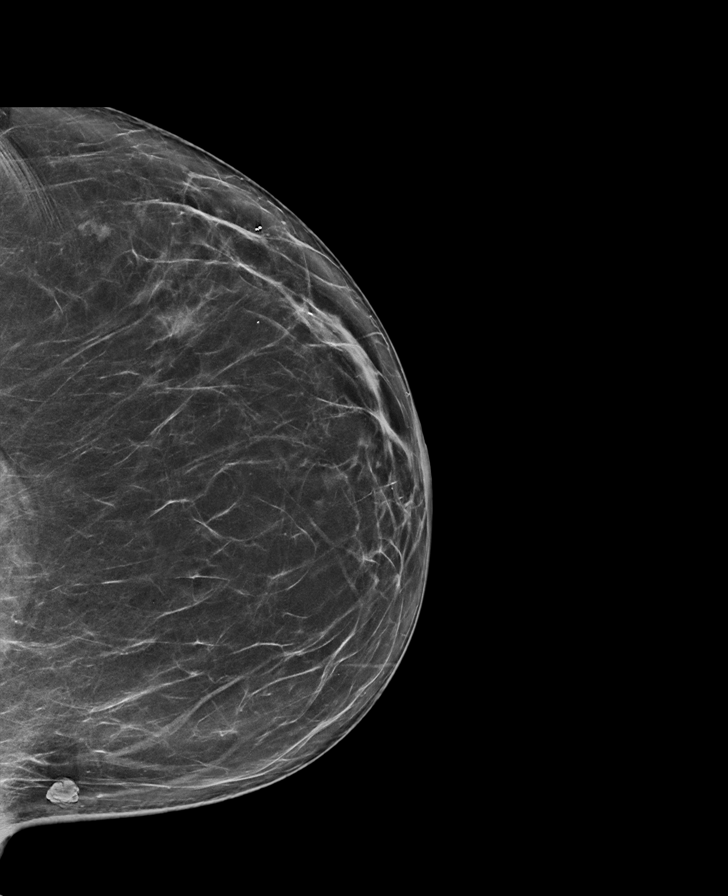

[R CC synth-2D]
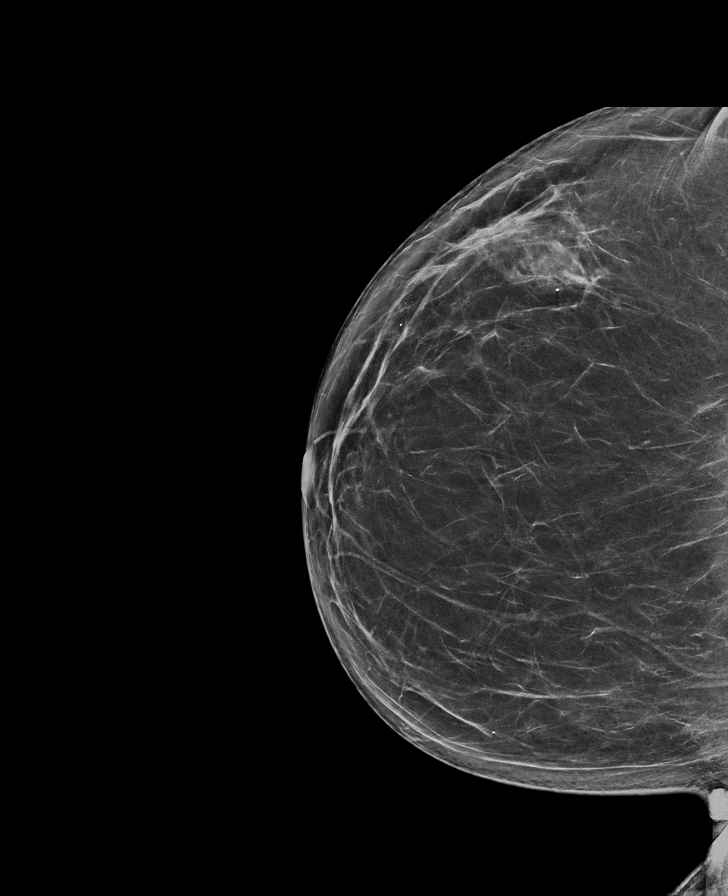

[L MLO synth-2D]
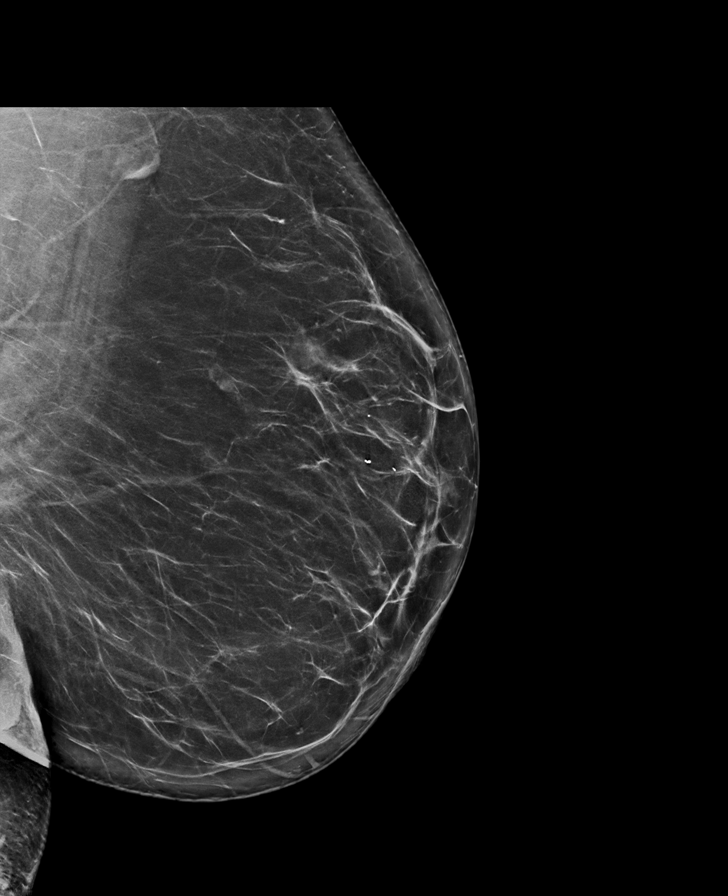

[R MLO synth-2D]
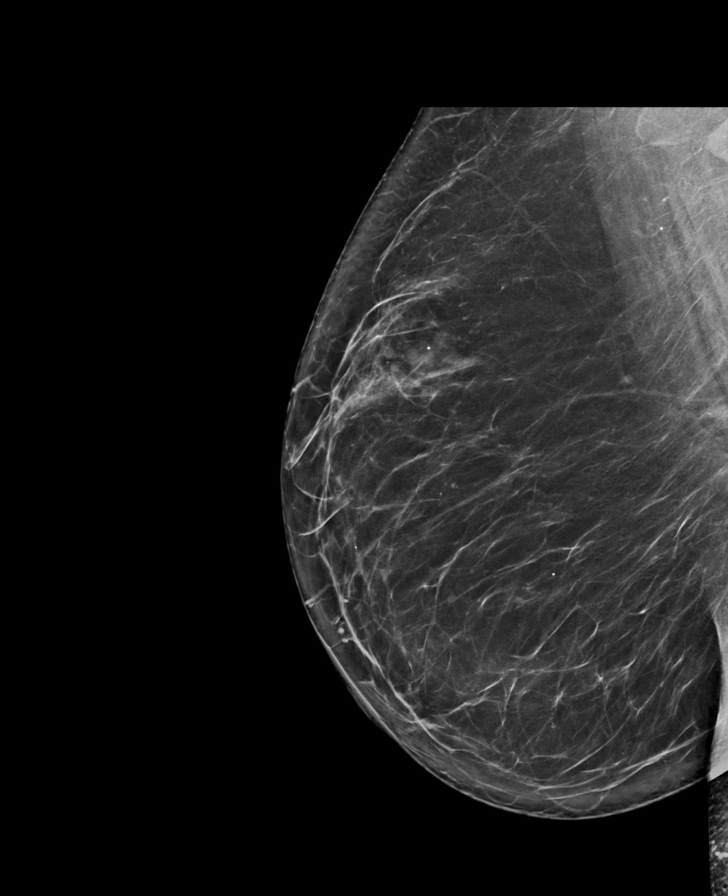

[R CC tomo · tomo slice 40/79.0]
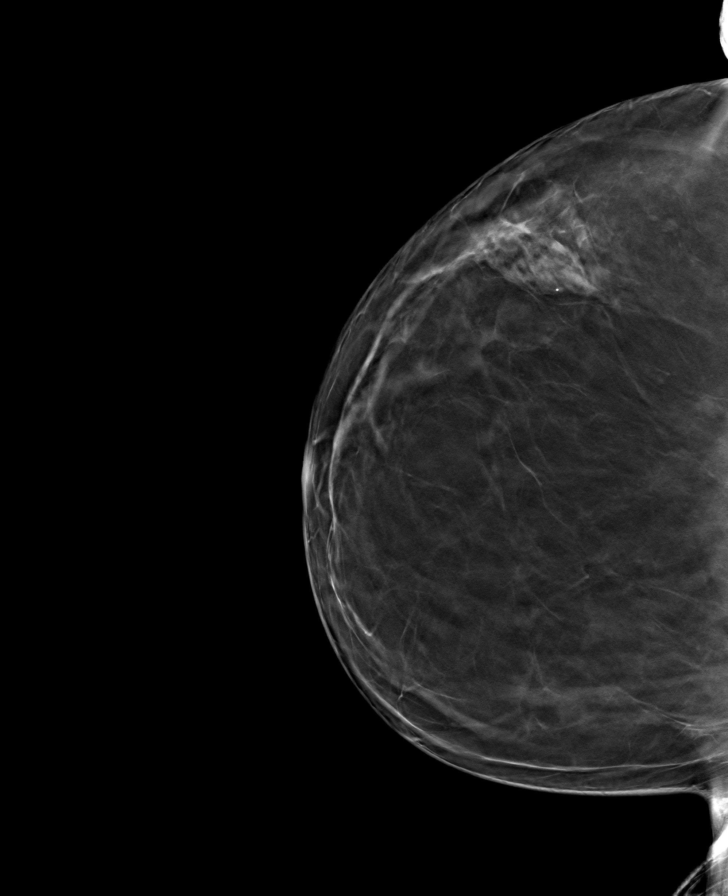

[L CC tomo · tomo slice 41/81.0]
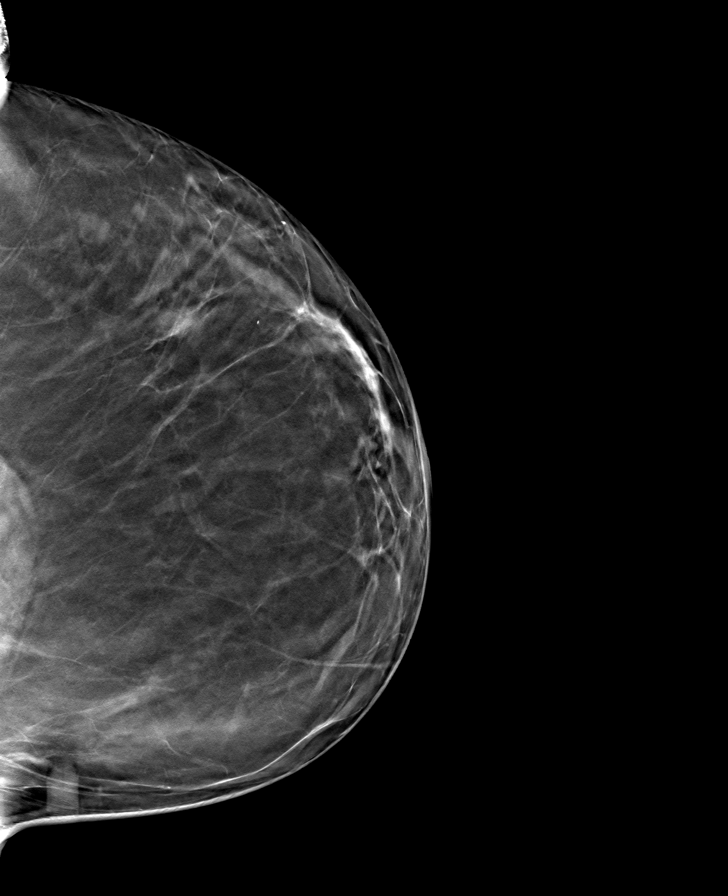

[L MLO tomo · tomo slice 45/90.0]
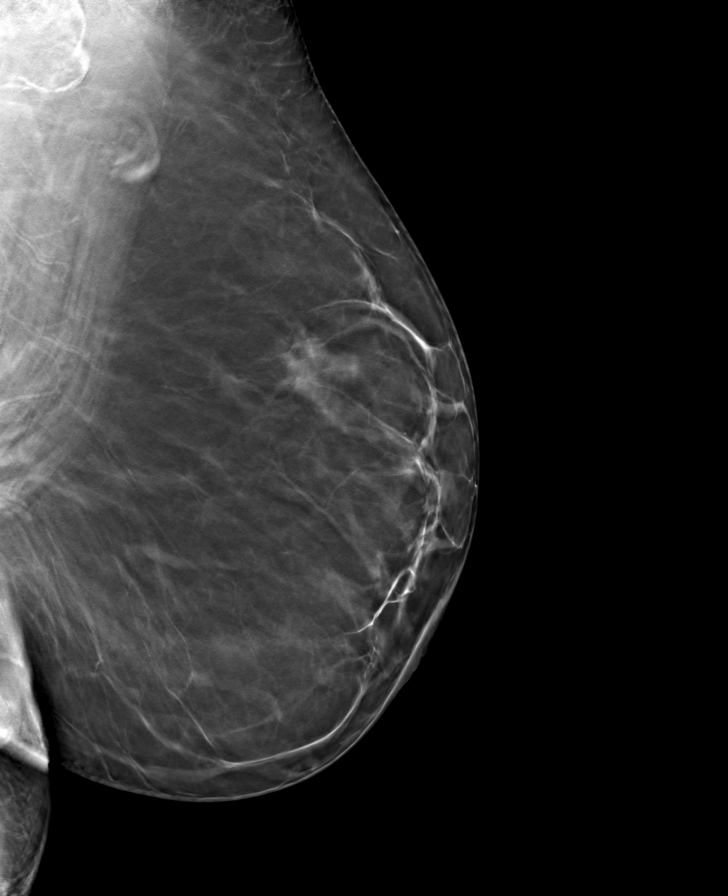

[R MLO tomo · tomo slice 44/87.0]
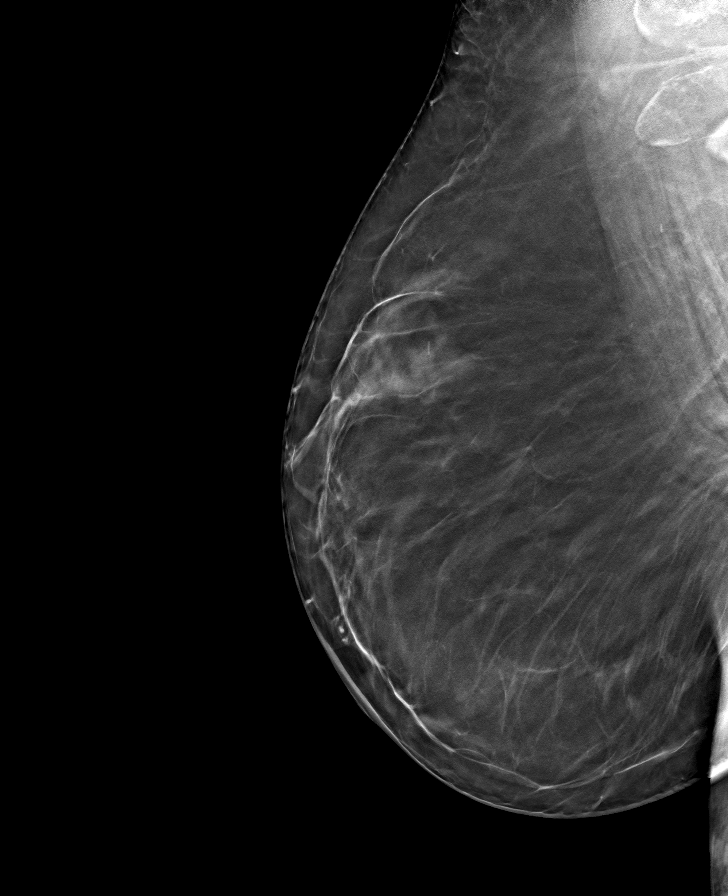

[8 of 24 positions shown; findings below may reference images not displayed]

ACR Breast Density Category b: There are scattered areas of
fibroglandular density.
FINDINGS: There are no findings suspicious for malignancy. Images were
processed with CAD.
IMPRESSION: No mammographic evidence of malignancy. A result letter of this
screening mammogram will be mailed directly to the patient.

RECOMMENDATION:
Screening mammogram in one year. (Code:Y5-G-EJ6)

BI-RADS CATEGORY  1: Negative.

## 2021-08-09 DIAGNOSIS — E1169 Type 2 diabetes mellitus with other specified complication: Secondary | ICD-10-CM | POA: Diagnosis not present

## 2021-09-08 DIAGNOSIS — E1169 Type 2 diabetes mellitus with other specified complication: Secondary | ICD-10-CM | POA: Diagnosis not present

## 2021-10-04 DIAGNOSIS — E039 Hypothyroidism, unspecified: Secondary | ICD-10-CM | POA: Diagnosis not present

## 2021-10-04 DIAGNOSIS — M8580 Other specified disorders of bone density and structure, unspecified site: Secondary | ICD-10-CM | POA: Diagnosis not present

## 2021-10-04 DIAGNOSIS — E1169 Type 2 diabetes mellitus with other specified complication: Secondary | ICD-10-CM | POA: Diagnosis not present

## 2021-10-04 DIAGNOSIS — E785 Hyperlipidemia, unspecified: Secondary | ICD-10-CM | POA: Diagnosis not present

## 2021-10-10 DIAGNOSIS — E1169 Type 2 diabetes mellitus with other specified complication: Secondary | ICD-10-CM | POA: Diagnosis not present

## 2021-11-07 DIAGNOSIS — E1169 Type 2 diabetes mellitus with other specified complication: Secondary | ICD-10-CM | POA: Diagnosis not present

## 2021-11-13 DIAGNOSIS — E782 Mixed hyperlipidemia: Secondary | ICD-10-CM | POA: Diagnosis not present

## 2021-11-13 DIAGNOSIS — R350 Frequency of micturition: Secondary | ICD-10-CM | POA: Diagnosis not present

## 2021-11-13 DIAGNOSIS — E039 Hypothyroidism, unspecified: Secondary | ICD-10-CM | POA: Diagnosis not present

## 2021-11-13 DIAGNOSIS — R102 Pelvic and perineal pain: Secondary | ICD-10-CM | POA: Diagnosis not present

## 2021-11-13 DIAGNOSIS — R809 Proteinuria, unspecified: Secondary | ICD-10-CM | POA: Diagnosis not present

## 2021-11-13 DIAGNOSIS — Z0000 Encounter for general adult medical examination without abnormal findings: Secondary | ICD-10-CM | POA: Diagnosis not present

## 2021-11-13 DIAGNOSIS — E1169 Type 2 diabetes mellitus with other specified complication: Secondary | ICD-10-CM | POA: Diagnosis not present

## 2021-11-16 DIAGNOSIS — E782 Mixed hyperlipidemia: Secondary | ICD-10-CM | POA: Diagnosis not present

## 2021-11-16 DIAGNOSIS — E1169 Type 2 diabetes mellitus with other specified complication: Secondary | ICD-10-CM | POA: Diagnosis not present

## 2022-01-10 DIAGNOSIS — E119 Type 2 diabetes mellitus without complications: Secondary | ICD-10-CM | POA: Diagnosis not present

## 2022-01-11 DIAGNOSIS — H524 Presbyopia: Secondary | ICD-10-CM | POA: Diagnosis not present

## 2022-04-03 DIAGNOSIS — E1169 Type 2 diabetes mellitus with other specified complication: Secondary | ICD-10-CM | POA: Diagnosis not present

## 2022-04-03 DIAGNOSIS — E782 Mixed hyperlipidemia: Secondary | ICD-10-CM | POA: Diagnosis not present

## 2022-04-23 DIAGNOSIS — M5416 Radiculopathy, lumbar region: Secondary | ICD-10-CM | POA: Diagnosis not present

## 2022-05-07 DIAGNOSIS — M545 Low back pain, unspecified: Secondary | ICD-10-CM | POA: Diagnosis not present

## 2022-05-11 DIAGNOSIS — M533 Sacrococcygeal disorders, not elsewhere classified: Secondary | ICD-10-CM | POA: Diagnosis not present

## 2022-06-12 DIAGNOSIS — M533 Sacrococcygeal disorders, not elsewhere classified: Secondary | ICD-10-CM | POA: Diagnosis not present

## 2022-06-25 DIAGNOSIS — G5702 Lesion of sciatic nerve, left lower limb: Secondary | ICD-10-CM | POA: Diagnosis not present

## 2022-07-31 DIAGNOSIS — G5702 Lesion of sciatic nerve, left lower limb: Secondary | ICD-10-CM | POA: Diagnosis not present

## 2022-08-15 DIAGNOSIS — G5702 Lesion of sciatic nerve, left lower limb: Secondary | ICD-10-CM | POA: Diagnosis not present

## 2022-08-24 DIAGNOSIS — G5702 Lesion of sciatic nerve, left lower limb: Secondary | ICD-10-CM | POA: Diagnosis not present

## 2022-08-27 DIAGNOSIS — G5702 Lesion of sciatic nerve, left lower limb: Secondary | ICD-10-CM | POA: Diagnosis not present

## 2022-09-19 ENCOUNTER — Other Ambulatory Visit (HOSPITAL_COMMUNITY): Payer: Self-pay

## 2022-09-20 ENCOUNTER — Other Ambulatory Visit (HOSPITAL_COMMUNITY): Payer: Self-pay

## 2022-09-20 MED ORDER — JARDIANCE 10 MG PO TABS
10.0000 mg | ORAL_TABLET | Freq: Every morning | ORAL | 1 refills | Status: AC
  Filled 2022-09-20: qty 30, 30d supply, fill #0

## 2022-09-21 DIAGNOSIS — M7072 Other bursitis of hip, left hip: Secondary | ICD-10-CM | POA: Diagnosis not present

## 2022-10-05 DIAGNOSIS — M8580 Other specified disorders of bone density and structure, unspecified site: Secondary | ICD-10-CM | POA: Diagnosis not present

## 2022-10-05 DIAGNOSIS — E1169 Type 2 diabetes mellitus with other specified complication: Secondary | ICD-10-CM | POA: Diagnosis not present

## 2022-10-05 DIAGNOSIS — Z6830 Body mass index (BMI) 30.0-30.9, adult: Secondary | ICD-10-CM | POA: Diagnosis not present

## 2022-10-05 DIAGNOSIS — Z1239 Encounter for other screening for malignant neoplasm of breast: Secondary | ICD-10-CM | POA: Diagnosis not present

## 2022-10-05 DIAGNOSIS — E2839 Other primary ovarian failure: Secondary | ICD-10-CM | POA: Diagnosis not present

## 2022-10-05 DIAGNOSIS — F411 Generalized anxiety disorder: Secondary | ICD-10-CM | POA: Diagnosis not present

## 2022-10-05 DIAGNOSIS — R809 Proteinuria, unspecified: Secondary | ICD-10-CM | POA: Diagnosis not present

## 2022-10-05 DIAGNOSIS — E782 Mixed hyperlipidemia: Secondary | ICD-10-CM | POA: Diagnosis not present

## 2022-10-08 ENCOUNTER — Other Ambulatory Visit: Payer: Self-pay | Admitting: Family Medicine

## 2022-10-08 DIAGNOSIS — Z1231 Encounter for screening mammogram for malignant neoplasm of breast: Secondary | ICD-10-CM

## 2022-10-08 DIAGNOSIS — M8580 Other specified disorders of bone density and structure, unspecified site: Secondary | ICD-10-CM

## 2022-10-08 DIAGNOSIS — E2839 Other primary ovarian failure: Secondary | ICD-10-CM

## 2022-10-11 DIAGNOSIS — M7072 Other bursitis of hip, left hip: Secondary | ICD-10-CM | POA: Diagnosis not present

## 2022-10-22 DIAGNOSIS — M25552 Pain in left hip: Secondary | ICD-10-CM | POA: Diagnosis not present

## 2022-10-22 DIAGNOSIS — M5416 Radiculopathy, lumbar region: Secondary | ICD-10-CM | POA: Diagnosis not present

## 2022-10-30 DIAGNOSIS — M25552 Pain in left hip: Secondary | ICD-10-CM | POA: Diagnosis not present

## 2022-11-02 DIAGNOSIS — M25551 Pain in right hip: Secondary | ICD-10-CM | POA: Diagnosis not present

## 2022-11-08 DIAGNOSIS — M25552 Pain in left hip: Secondary | ICD-10-CM | POA: Diagnosis not present

## 2022-11-23 ENCOUNTER — Ambulatory Visit: Payer: Medicare HMO

## 2022-12-04 DIAGNOSIS — M533 Sacrococcygeal disorders, not elsewhere classified: Secondary | ICD-10-CM | POA: Diagnosis not present

## 2022-12-11 DIAGNOSIS — M7072 Other bursitis of hip, left hip: Secondary | ICD-10-CM | POA: Diagnosis not present

## 2023-01-07 DIAGNOSIS — M7072 Other bursitis of hip, left hip: Secondary | ICD-10-CM | POA: Diagnosis not present

## 2023-02-12 DIAGNOSIS — E119 Type 2 diabetes mellitus without complications: Secondary | ICD-10-CM | POA: Diagnosis not present

## 2023-03-11 ENCOUNTER — Ambulatory Visit
Admission: RE | Admit: 2023-03-11 | Discharge: 2023-03-11 | Disposition: A | Discharge status: Home / Self Care | Payer: PPO | Source: Ambulatory Visit | Attending: Family Medicine | Admitting: Family Medicine

## 2023-03-11 DIAGNOSIS — N958 Other specified menopausal and perimenopausal disorders: Secondary | ICD-10-CM | POA: Diagnosis not present

## 2023-03-11 DIAGNOSIS — Z1231 Encounter for screening mammogram for malignant neoplasm of breast: Secondary | ICD-10-CM | POA: Diagnosis not present

## 2023-03-11 DIAGNOSIS — E349 Endocrine disorder, unspecified: Secondary | ICD-10-CM | POA: Diagnosis not present

## 2023-03-11 DIAGNOSIS — M8588 Other specified disorders of bone density and structure, other site: Secondary | ICD-10-CM | POA: Diagnosis not present

## 2023-03-11 DIAGNOSIS — E2839 Other primary ovarian failure: Secondary | ICD-10-CM

## 2023-03-11 DIAGNOSIS — M8580 Other specified disorders of bone density and structure, unspecified site: Secondary | ICD-10-CM

## 2023-03-19 DIAGNOSIS — M7072 Other bursitis of hip, left hip: Secondary | ICD-10-CM | POA: Diagnosis not present

## 2023-04-09 DIAGNOSIS — M791 Myalgia, unspecified site: Secondary | ICD-10-CM | POA: Diagnosis not present

## 2023-04-09 DIAGNOSIS — M7072 Other bursitis of hip, left hip: Secondary | ICD-10-CM | POA: Diagnosis not present

## 2023-08-02 DIAGNOSIS — R809 Proteinuria, unspecified: Secondary | ICD-10-CM | POA: Diagnosis not present

## 2023-08-02 DIAGNOSIS — M8580 Other specified disorders of bone density and structure, unspecified site: Secondary | ICD-10-CM | POA: Diagnosis not present

## 2023-08-02 DIAGNOSIS — E039 Hypothyroidism, unspecified: Secondary | ICD-10-CM | POA: Diagnosis not present

## 2023-08-02 DIAGNOSIS — E1169 Type 2 diabetes mellitus with other specified complication: Secondary | ICD-10-CM | POA: Diagnosis not present

## 2023-08-02 DIAGNOSIS — E782 Mixed hyperlipidemia: Secondary | ICD-10-CM | POA: Diagnosis not present

## 2023-08-02 DIAGNOSIS — Z1211 Encounter for screening for malignant neoplasm of colon: Secondary | ICD-10-CM | POA: Diagnosis not present

## 2023-08-02 DIAGNOSIS — E1165 Type 2 diabetes mellitus with hyperglycemia: Secondary | ICD-10-CM | POA: Diagnosis not present

## 2023-08-02 DIAGNOSIS — Z0000 Encounter for general adult medical examination without abnormal findings: Secondary | ICD-10-CM | POA: Diagnosis not present

## 2023-08-02 DIAGNOSIS — F411 Generalized anxiety disorder: Secondary | ICD-10-CM | POA: Diagnosis not present

## 2023-08-24 DIAGNOSIS — R112 Nausea with vomiting, unspecified: Secondary | ICD-10-CM | POA: Diagnosis not present

## 2023-08-24 DIAGNOSIS — K529 Noninfective gastroenteritis and colitis, unspecified: Secondary | ICD-10-CM | POA: Diagnosis not present

## 2023-08-24 DIAGNOSIS — E860 Dehydration: Secondary | ICD-10-CM | POA: Diagnosis not present

## 2024-04-07 DIAGNOSIS — F411 Generalized anxiety disorder: Secondary | ICD-10-CM | POA: Diagnosis not present

## 2024-04-07 DIAGNOSIS — R809 Proteinuria, unspecified: Secondary | ICD-10-CM | POA: Diagnosis not present

## 2024-04-07 DIAGNOSIS — E782 Mixed hyperlipidemia: Secondary | ICD-10-CM | POA: Diagnosis not present

## 2024-04-07 DIAGNOSIS — E1169 Type 2 diabetes mellitus with other specified complication: Secondary | ICD-10-CM | POA: Diagnosis not present

## 2024-04-07 DIAGNOSIS — E039 Hypothyroidism, unspecified: Secondary | ICD-10-CM | POA: Diagnosis not present

## 2024-04-07 DIAGNOSIS — M8580 Other specified disorders of bone density and structure, unspecified site: Secondary | ICD-10-CM | POA: Diagnosis not present

## 2024-04-07 DIAGNOSIS — M79601 Pain in right arm: Secondary | ICD-10-CM | POA: Diagnosis not present

## 2024-04-07 DIAGNOSIS — Z1211 Encounter for screening for malignant neoplasm of colon: Secondary | ICD-10-CM | POA: Diagnosis not present

## 2024-05-10 DIAGNOSIS — E1169 Type 2 diabetes mellitus with other specified complication: Secondary | ICD-10-CM | POA: Diagnosis not present

## 2024-05-10 DIAGNOSIS — F411 Generalized anxiety disorder: Secondary | ICD-10-CM | POA: Diagnosis not present

## 2024-05-10 DIAGNOSIS — E039 Hypothyroidism, unspecified: Secondary | ICD-10-CM | POA: Diagnosis not present

## 2024-05-10 DIAGNOSIS — E782 Mixed hyperlipidemia: Secondary | ICD-10-CM | POA: Diagnosis not present

## 2024-06-01 ENCOUNTER — Other Ambulatory Visit (HOSPITAL_COMMUNITY): Payer: Self-pay

## 2024-06-09 DIAGNOSIS — E1169 Type 2 diabetes mellitus with other specified complication: Secondary | ICD-10-CM | POA: Diagnosis not present

## 2024-06-09 DIAGNOSIS — E039 Hypothyroidism, unspecified: Secondary | ICD-10-CM | POA: Diagnosis not present

## 2024-06-09 DIAGNOSIS — E782 Mixed hyperlipidemia: Secondary | ICD-10-CM | POA: Diagnosis not present

## 2024-06-09 DIAGNOSIS — F411 Generalized anxiety disorder: Secondary | ICD-10-CM | POA: Diagnosis not present

## 2024-07-10 DIAGNOSIS — E1169 Type 2 diabetes mellitus with other specified complication: Secondary | ICD-10-CM | POA: Diagnosis not present

## 2024-07-10 DIAGNOSIS — E039 Hypothyroidism, unspecified: Secondary | ICD-10-CM | POA: Diagnosis not present

## 2024-07-10 DIAGNOSIS — E782 Mixed hyperlipidemia: Secondary | ICD-10-CM | POA: Diagnosis not present

## 2024-07-10 DIAGNOSIS — F411 Generalized anxiety disorder: Secondary | ICD-10-CM | POA: Diagnosis not present

## 2024-07-24 DIAGNOSIS — E119 Type 2 diabetes mellitus without complications: Secondary | ICD-10-CM | POA: Diagnosis not present

## 2024-08-09 DIAGNOSIS — F411 Generalized anxiety disorder: Secondary | ICD-10-CM | POA: Diagnosis not present

## 2024-08-09 DIAGNOSIS — E039 Hypothyroidism, unspecified: Secondary | ICD-10-CM | POA: Diagnosis not present

## 2024-08-09 DIAGNOSIS — E782 Mixed hyperlipidemia: Secondary | ICD-10-CM | POA: Diagnosis not present

## 2024-08-09 DIAGNOSIS — E1169 Type 2 diabetes mellitus with other specified complication: Secondary | ICD-10-CM | POA: Diagnosis not present

## 2024-09-01 ENCOUNTER — Encounter: Payer: Self-pay | Admitting: *Deleted

## 2024-09-01 NOTE — Progress Notes (Unsigned)
 Sabrin Dunlevy                                          MRN: [id number]   09/01/2024   The [facility] Quality Team Specialist reviewed this patient medical record for the purposes of chart review for care gap closure. The following were reviewed: {CHL AMB [facility] QUALITY SPECIALIST REASON FOR REVIEW:[id number]}.    [facility] Quality Team
